# Patient Record
Sex: Female | Born: 1975 | Race: White | Hispanic: No | Smoking: Former smoker
Health system: Southern US, Community
[De-identification: ages and names within clinical notes are randomized; demographics above are authoritative.]

## PROBLEM LIST (undated history)

## (undated) DIAGNOSIS — M5136 Other intervertebral disc degeneration, lumbar region: Secondary | ICD-10-CM

## (undated) DIAGNOSIS — N200 Calculus of kidney: Secondary | ICD-10-CM

## (undated) DIAGNOSIS — M5030 Other cervical disc degeneration, unspecified cervical region: Secondary | ICD-10-CM

## (undated) DIAGNOSIS — K219 Gastro-esophageal reflux disease without esophagitis: Secondary | ICD-10-CM

## (undated) HISTORY — DX: Other intervertebral disc degeneration, lumbar region: M51.36

## (undated) HISTORY — DX: Gastro-esophageal reflux disease without esophagitis: K21.9

## (undated) HISTORY — DX: Calculus of kidney: N20.0

## (undated) HISTORY — DX: Other cervical disc degeneration, unspecified cervical region: M50.30

---

## 2001-10-02 DIAGNOSIS — M5136 Other intervertebral disc degeneration, lumbar region: Secondary | ICD-10-CM

## 2001-10-02 DIAGNOSIS — M51369 Other intervertebral disc degeneration, lumbar region without mention of lumbar back pain or lower extremity pain: Secondary | ICD-10-CM

## 2001-10-02 HISTORY — DX: Other intervertebral disc degeneration, lumbar region without mention of lumbar back pain or lower extremity pain: M51.369

## 2001-10-02 HISTORY — DX: Other intervertebral disc degeneration, lumbar region: M51.36

## 2002-01-29 ENCOUNTER — Encounter: Admission: RE | Admit: 2002-01-29 | Discharge: 2002-01-29 | Payer: Self-pay | Admitting: Orthopedic Surgery

## 2002-01-29 ENCOUNTER — Encounter: Payer: Self-pay | Admitting: Orthopedic Surgery

## 2002-02-12 ENCOUNTER — Encounter: Payer: Self-pay | Admitting: Orthopedic Surgery

## 2002-02-12 ENCOUNTER — Encounter: Admission: RE | Admit: 2002-02-12 | Discharge: 2002-02-12 | Payer: Self-pay | Admitting: Orthopedic Surgery

## 2010-07-02 DIAGNOSIS — N200 Calculus of kidney: Secondary | ICD-10-CM

## 2010-07-02 HISTORY — DX: Calculus of kidney: N20.0

## 2010-12-01 DIAGNOSIS — M5030 Other cervical disc degeneration, unspecified cervical region: Secondary | ICD-10-CM

## 2010-12-01 HISTORY — DX: Other cervical disc degeneration, unspecified cervical region: M50.30

## 2011-03-17 ENCOUNTER — Encounter: Payer: Self-pay | Admitting: Family Medicine

## 2011-03-17 ENCOUNTER — Ambulatory Visit (INDEPENDENT_AMBULATORY_CARE_PROVIDER_SITE_OTHER): Payer: 59 | Admitting: Family Medicine

## 2011-03-17 ENCOUNTER — Telehealth: Payer: Self-pay | Admitting: *Deleted

## 2011-03-17 VITALS — BP 144/91 | HR 99 | Ht 65.0 in | Wt 221.0 lb

## 2011-03-17 DIAGNOSIS — E669 Obesity, unspecified: Secondary | ICD-10-CM

## 2011-03-17 DIAGNOSIS — M5412 Radiculopathy, cervical region: Secondary | ICD-10-CM

## 2011-03-17 DIAGNOSIS — M5090 Cervical disc disorder, unspecified, unspecified cervical region: Secondary | ICD-10-CM

## 2011-03-17 DIAGNOSIS — M5010 Cervical disc disorder with radiculopathy, unspecified cervical region: Secondary | ICD-10-CM

## 2011-03-17 MED ORDER — OXYCODONE-ACETAMINOPHEN 5-500 MG PO TABS: ORAL_TABLET | ORAL | Status: DC

## 2011-03-17 MED ORDER — AMITRIPTYLINE HCL 25 MG PO TABS: 25.0000 mg | ORAL_TABLET | Freq: Every day | ORAL | Status: AC

## 2011-03-17 NOTE — Telephone Encounter (Signed)
error 

## 2011-03-17 NOTE — Patient Instructions (Signed)
Neurosurg referral put in for Dr Lovell Sheehan.  Start Amitriptyline at night for nerve root pain.  Cut in half if they make you too sleepy in the morning.  Use Percocet 1/2 to 1 tab up to 3 x  A day for severe pain.  Can alternate with Aleve 2 tabs in the AM and 2 tabs at night with food.  Use Colace + Miralax if needed for constipation.  Return for f/u in 4 wks.
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
## 2011-03-19 ENCOUNTER — Encounter: Payer: Self-pay | Admitting: Family Medicine

## 2011-03-19 DIAGNOSIS — E669 Obesity, unspecified: Secondary | ICD-10-CM | POA: Insufficient documentation

## 2011-03-19 DIAGNOSIS — M5412 Radiculopathy, cervical region: Secondary | ICD-10-CM | POA: Insufficient documentation

## 2011-03-19 NOTE — Assessment & Plan Note (Signed)
I reviewed her MRI done in the past wk, + for disc dz with extrusion.  Because she has weakness into both arms, affecting function, I agree with having her see neurosurgery.  Referral made to [facility] to review her case with her MRI.  She has already been on oral steroids, pain meds, NSAIDs, muscle relaxers w/o much benefit.    Will change her Tylenol #3 to Percocet, using 1/2 to 1 tab up to 3 x a day.  Cautioned about sedation and constipation.  Will keep her out of work until she sees the Careers adviser.  Will add Amitriptyline at night for nerve root pain/ sleep.  Will need to re-eval her anxiety and sleep issues to see if this is fully related to her daily pain or not.

## 2011-04-24 ENCOUNTER — Ambulatory Visit (INDEPENDENT_AMBULATORY_CARE_PROVIDER_SITE_OTHER): Payer: Self-pay | Admitting: Family Medicine

## 2011-04-24 ENCOUNTER — Encounter: Payer: Self-pay | Admitting: Family Medicine

## 2011-04-24 VITALS — BP 121/76 | HR 84 | Ht 65.0 in | Wt 221.0 lb

## 2011-04-24 DIAGNOSIS — M542 Cervicalgia: Secondary | ICD-10-CM

## 2011-04-24 MED ORDER — OXYCODONE-ACETAMINOPHEN 5-500 MG PO TABS: ORAL_TABLET | ORAL | Status: DC
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
## 2011-04-24 NOTE — Patient Instructions (Signed)
Go up on Amitriptyline to a full tab every night. Stay on Aleve with breakfast and dinner, using Percocet for severe pain breakthrough.  Will fax MRI results to Dr Lovell Sheehan.  If you don't hear back about an appointment by tomorrow, call their office to schedule.  I will mail your forms in for you.  Return for f/u in 2 mos.

## 2011-05-01 ENCOUNTER — Telehealth: Payer: Self-pay | Admitting: Family Medicine

## 2011-05-01 NOTE — Telephone Encounter (Signed)
Pt aware. Letter is at front for Pt to pickup

## 2011-05-01 NOTE — Telephone Encounter (Signed)
lettter printed.  Let me know where she wants it to go.

## 2011-05-01 NOTE — Telephone Encounter (Signed)
Pt called and said she was seen in the office recently and wanted to discuss with Dr. Cathey Endow about getting massage/body work therapy, but said she got into conversation about other things and forgot to mention.  Wants to know if Dr. Cathey Endow can write a note for medical clearance for her to get massage/body work therapy.  Has a friend willing to do for free, but said the pt has to have a note for medical clearance. Please advise. Jarvis Newcomer, LPN [facility]

## 2011-05-18 ENCOUNTER — Telehealth: Payer: Self-pay | Admitting: Family Medicine

## 2011-05-18 NOTE — Telephone Encounter (Signed)
Patient called request a call back would like to speak with you about her disability Dr. Cathey Endow wrote her out of work until Sept 18th but she needs some documentation sent to the disability company and would like for you to call her to discuss.

## 2011-05-19 NOTE — Telephone Encounter (Signed)
Pt to call and get fax # so I can fax work note

## 2011-06-07 ENCOUNTER — Ambulatory Visit (INDEPENDENT_AMBULATORY_CARE_PROVIDER_SITE_OTHER): Payer: 59 | Admitting: Family Medicine

## 2011-06-07 ENCOUNTER — Encounter: Payer: Self-pay | Admitting: Family Medicine

## 2011-06-07 VITALS — BP 105/65 | HR 76 | Ht 65.0 in | Wt 221.0 lb

## 2011-06-07 DIAGNOSIS — M5090 Cervical disc disorder, unspecified, unspecified cervical region: Secondary | ICD-10-CM

## 2011-06-07 MED ORDER — GABAPENTIN 300 MG PO CAPS: 300.0000 mg | ORAL_CAPSULE | Freq: Three times a day (TID) | ORAL | Status: DC

## 2011-06-19 ENCOUNTER — Telehealth: Payer: Self-pay | Admitting: Family Medicine

## 2011-06-19 NOTE — Telephone Encounter (Signed)
Pt called and was seen on 06-07-11 by Dr. Thurmond Butts.  She left forms for long term disability with him to complete, and pt has called several times and no one knows about the forms.  Pt information for short term was handles by a phone call, but pt says the forms she left with Dr. Thurmond Butts is for long term and she needs to get them back completed.  Told pt Dr. Thurmond Butts will be in on Tuesday and will ask him aobut the forms then. Plan:  Routed encounter to Dr. Thurmond Butts for review. Jarvis Newcomer, LPN [facility]

## 2011-06-20 ENCOUNTER — Ambulatory Visit: Payer: Self-pay | Admitting: Family Medicine

## 2011-06-20 ENCOUNTER — Telehealth: Payer: Self-pay | Admitting: Family Medicine
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
## 2011-06-21 ENCOUNTER — Telehealth: Payer: Self-pay | Admitting: Family Medicine

## 2011-06-21 NOTE — Telephone Encounter (Signed)
Message was given to Dr. Thurmond Butts yesterday. Jarvis Newcomer, LPN [facility]
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
## 2011-06-21 NOTE — Telephone Encounter (Signed)
Advised pt that her long term disability papers have been completed and she can pickup.  Told pt there will be a $20.00 charge. Jarvis Newcomer, LPN [facility]

## 2011-06-22 ENCOUNTER — Encounter (HOSPITAL_COMMUNITY)
Admission: RE | Admit: 2011-06-22 | Discharge: 2011-06-22 | Disposition: A | Discharge status: Home / Self Care | Payer: 59 | Source: Ambulatory Visit | Attending: Neurosurgery | Admitting: Neurosurgery

## 2011-06-22 LAB — CBC
HCT: 39.9 % (ref 36.0–46.0)
Hemoglobin: 14.1 g/dL (ref 12.0–15.0)
MCH: 30.9 pg (ref 26.0–34.0)
MCHC: 35.3 g/dL (ref 30.0–36.0)
MCV: 87.3 fL (ref 78.0–100.0)
Platelets: 220 10*3/uL (ref 150–400)
RBC: 4.57 MIL/uL (ref 3.87–5.11)
RDW: 12.8 % (ref 11.5–15.5)
WBC: 11 10*3/uL — ABNORMAL HIGH (ref 4.0–10.5)

## 2011-06-22 LAB — HCG, SERUM, QUALITATIVE: Preg, Serum: NEGATIVE

## 2011-06-22 LAB — SURGICAL PCR SCREEN: MRSA, PCR: NEGATIVE

## 2011-07-03 ENCOUNTER — Ambulatory Visit (HOSPITAL_COMMUNITY): Payer: 59

## 2011-07-03 ENCOUNTER — Ambulatory Visit (HOSPITAL_COMMUNITY)
Admission: RE | Admit: 2011-07-03 | Discharge: 2011-07-04 | Disposition: A | Discharge status: Home / Self Care | Payer: 59 | Source: Ambulatory Visit | Attending: Neurosurgery | Admitting: Neurosurgery

## 2011-07-03 DIAGNOSIS — M47812 Spondylosis without myelopathy or radiculopathy, cervical region: Secondary | ICD-10-CM | POA: Insufficient documentation

## 2011-07-03 DIAGNOSIS — E669 Obesity, unspecified: Secondary | ICD-10-CM | POA: Insufficient documentation

## 2011-07-03 DIAGNOSIS — M5020 Other cervical disc displacement, unspecified cervical region: Secondary | ICD-10-CM | POA: Insufficient documentation

## 2011-07-03 DIAGNOSIS — Z01812 Encounter for preprocedural laboratory examination: Secondary | ICD-10-CM | POA: Insufficient documentation

## 2011-07-03 HISTORY — PX: OTHER SURGICAL HISTORY: SHX169
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
## 2011-07-11 ENCOUNTER — Ambulatory Visit: Payer: Self-pay | Admitting: Family Medicine

## 2011-08-03 ENCOUNTER — Encounter: Payer: Self-pay | Admitting: Family Medicine

## 2012-05-21 IMAGING — CR DG CERVICAL SPINE 2 OR 3 VIEWS
1 series · 1 of 1 positions shown · non-contrast
Comparison: None.

CLINICAL DATA: Neck pain

CERVICAL SPINE - 2-3 VIEW

[view not recorded]
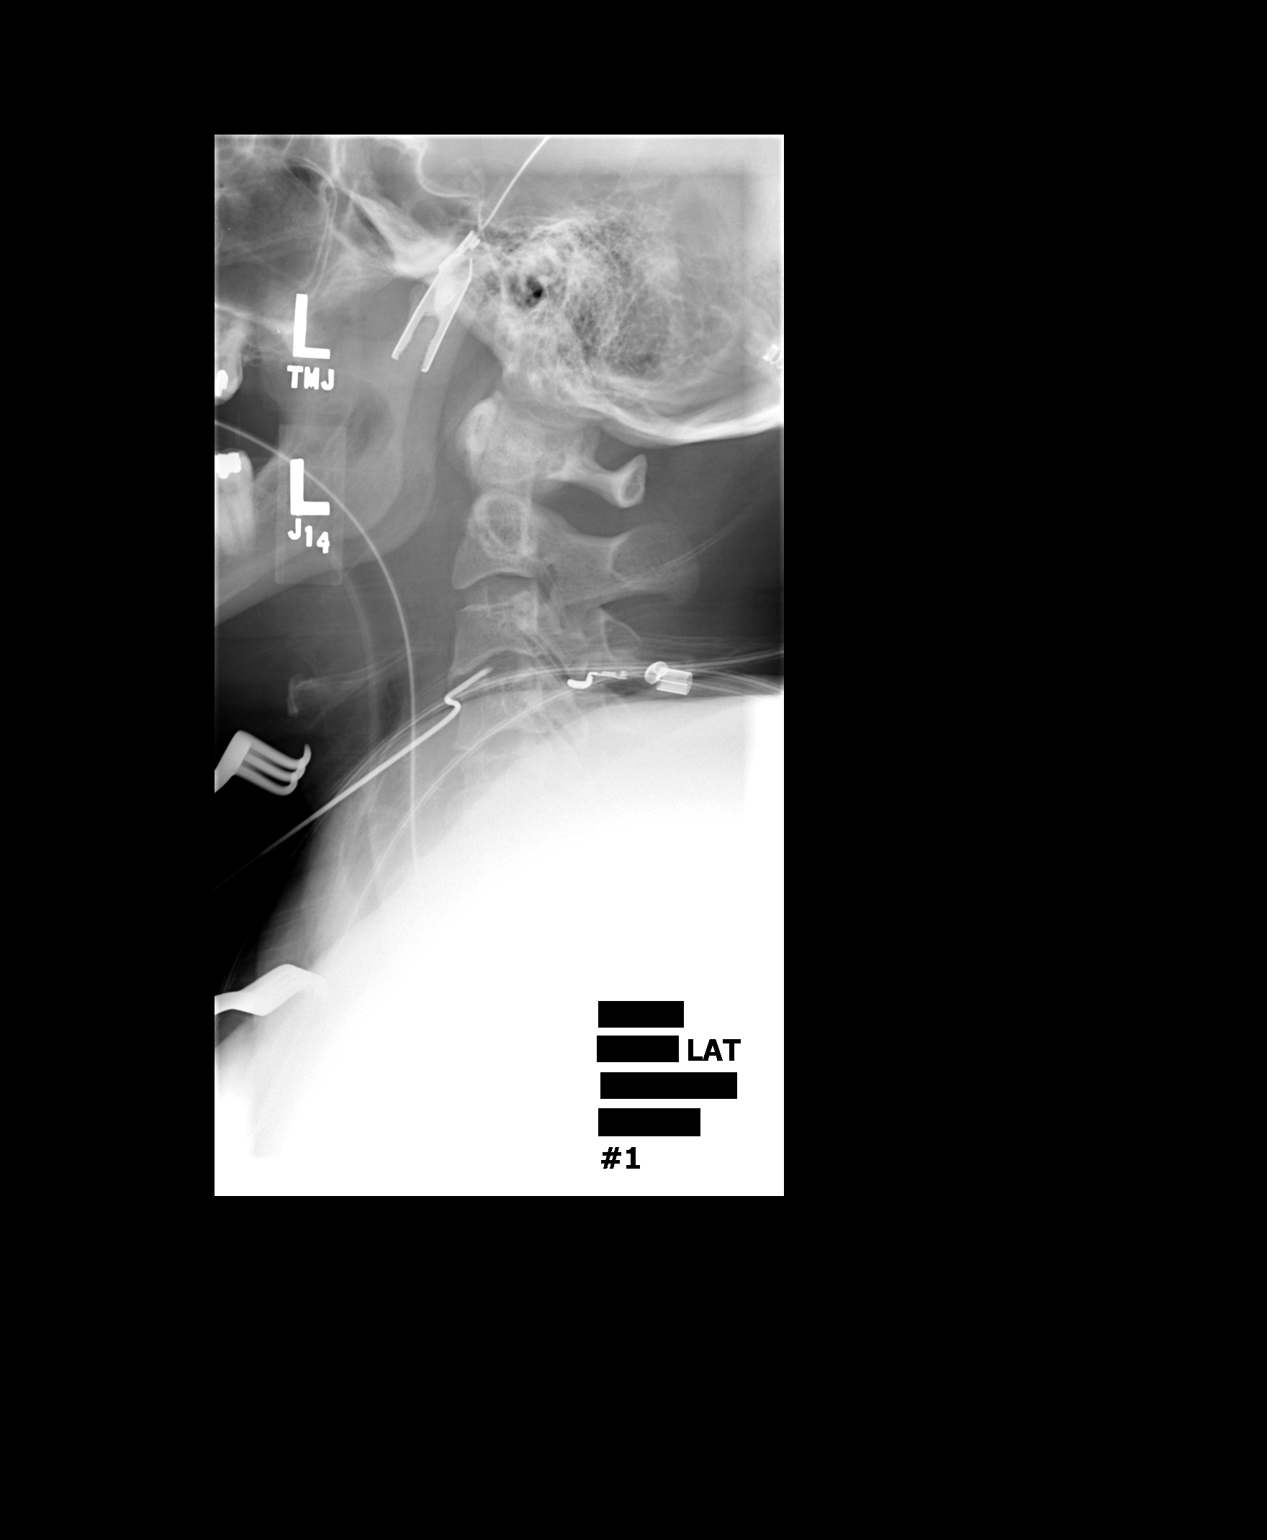

[1 of 1 positions shown; findings below may reference images not displayed]

FINDINGS: Film #1 demonstrates a needle at C3-C4.  Film #2
demonstrates a C4-5 and C6-7 ACDF with plating.  The C6-7 fusion is
incompletely evaluated due to shoulder artifact.  A sponge is seen
in the soft tissues of the neck.
IMPRESSION: C4-5 and C6-7 fusion as described.

## 2012-05-21 IMAGING — CR DG CERVICAL SPINE 2 OR 3 VIEWS
1 series · 1 of 1 positions shown · non-contrast
Comparison: None.

CLINICAL DATA: Neck pain

CERVICAL SPINE - 2-3 VIEW

[view not recorded]
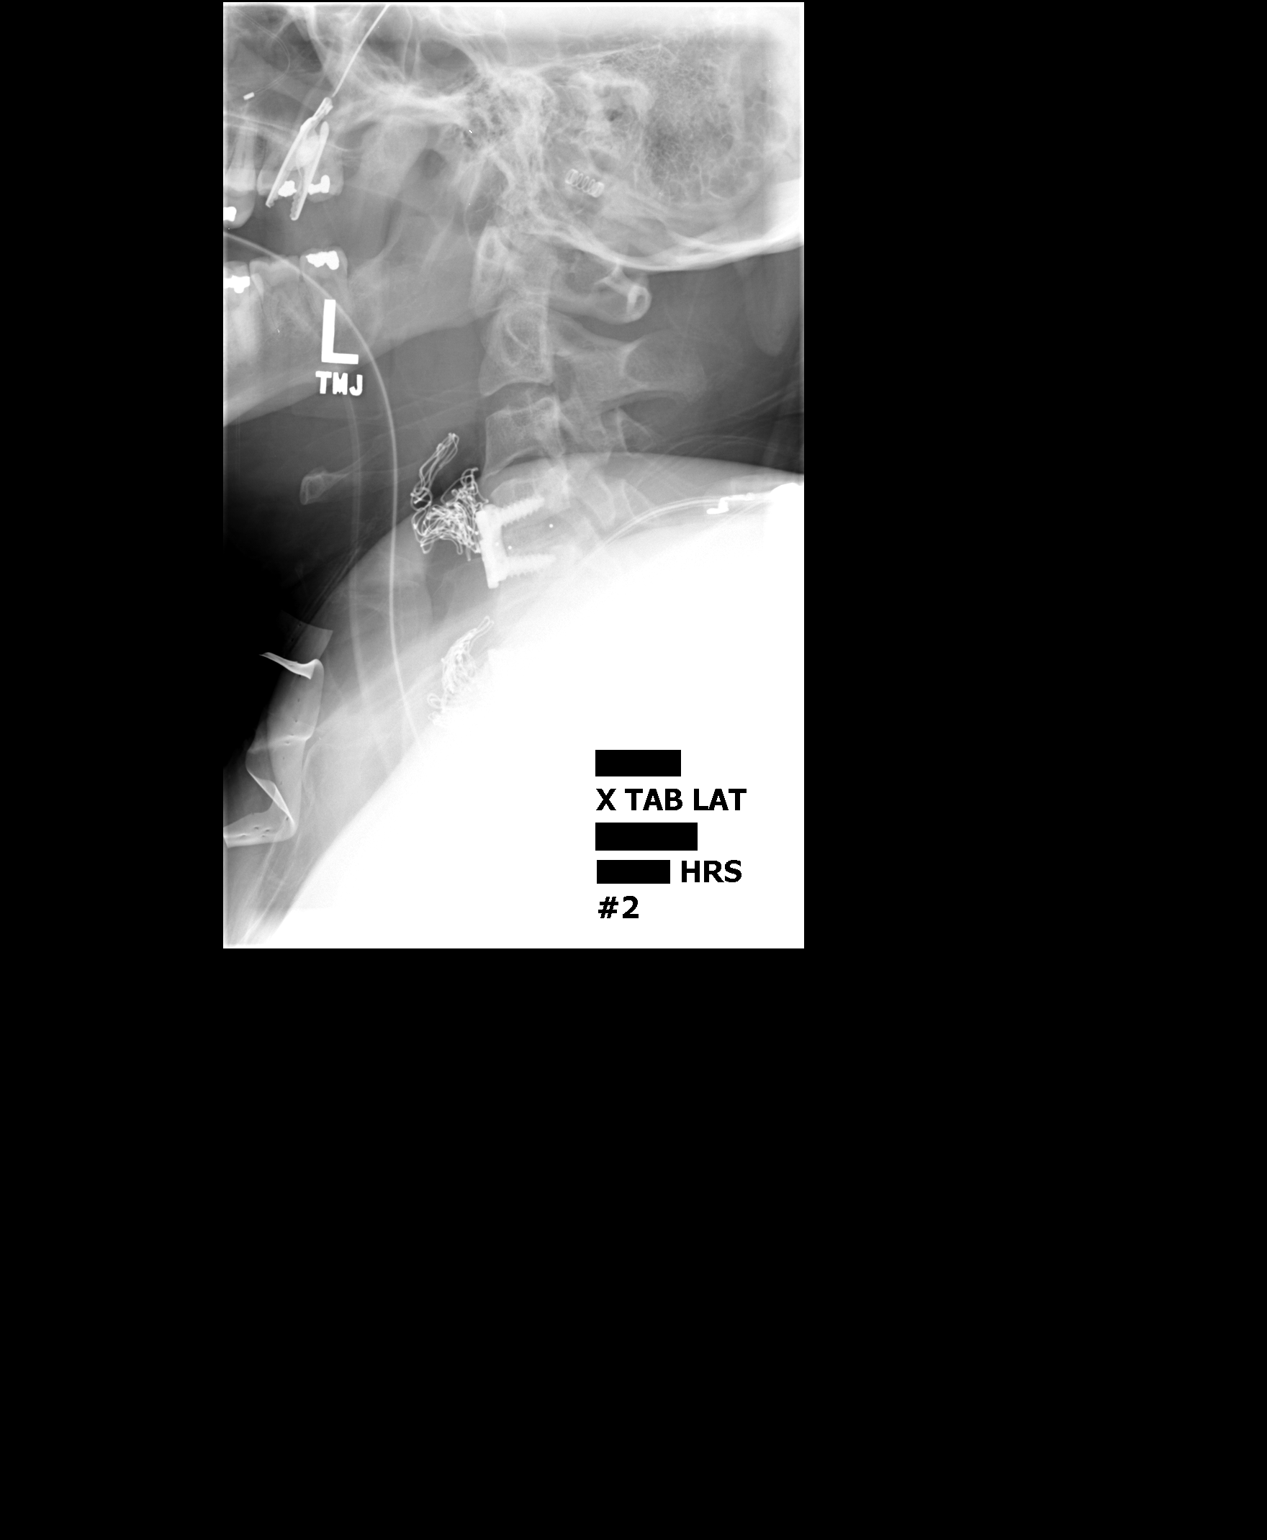

[1 of 1 positions shown; findings below may reference images not displayed]

FINDINGS: Film #1 demonstrates a needle at C3-C4.  Film #2
demonstrates a C4-5 and C6-7 ACDF with plating.  The C6-7 fusion is
incompletely evaluated due to shoulder artifact.  A sponge is seen
in the soft tissues of the neck.
IMPRESSION: C4-5 and C6-7 fusion as described.

## 2012-06-06 ENCOUNTER — Encounter: Payer: Self-pay | Admitting: Obstetrics & Gynecology

## 2012-06-06 ENCOUNTER — Other Ambulatory Visit (HOSPITAL_COMMUNITY)
Admission: RE | Admit: 2012-06-06 | Discharge: 2012-06-06 | Disposition: A | Discharge status: Home / Self Care | Payer: Medicaid Other | Source: Ambulatory Visit | Attending: Obstetrics & Gynecology | Admitting: Obstetrics & Gynecology

## 2012-06-06 ENCOUNTER — Ambulatory Visit (INDEPENDENT_AMBULATORY_CARE_PROVIDER_SITE_OTHER): Payer: Medicaid Other | Admitting: Obstetrics & Gynecology

## 2012-06-06 VITALS — BP 111/72 | HR 74 | Temp 98.5°F | Resp 16 | Ht 65.0 in | Wt 186.0 lb

## 2012-06-06 DIAGNOSIS — Z01419 Encounter for gynecological examination (general) (routine) without abnormal findings: Secondary | ICD-10-CM

## 2012-06-06 DIAGNOSIS — F419 Anxiety disorder, unspecified: Secondary | ICD-10-CM

## 2012-06-06 DIAGNOSIS — Z1151 Encounter for screening for human papillomavirus (HPV): Secondary | ICD-10-CM | POA: Insufficient documentation

## 2012-06-06 DIAGNOSIS — Z113 Encounter for screening for infections with a predominantly sexual mode of transmission: Secondary | ICD-10-CM | POA: Insufficient documentation

## 2012-06-06 DIAGNOSIS — Z308 Encounter for other contraceptive management: Secondary | ICD-10-CM

## 2012-06-06 NOTE — Patient Instructions (Signed)

## 2012-06-06 NOTE — Progress Notes (Signed)
  Subjective:     Erin Howell is a 36 y.o. female here for a routine exam.  Current complaints: wants birth control.  Personal health questionnaire reviewed: yes.   Gynecologic History Patient's last menstrual period was 05/21/2012. Contraception: none Last Pap: 5 yrs ago. Results were: normal Last mammogram: n/a. Results were: n/a  Obstetric History OB History    Grav Para Term Preterm Abortions TAB SAB Ect Mult Living   0 0 0 0 0 0 0 0 0 0        The following portions of the patient's history were reviewed and updated as appropriate: allergies, current medications, past family history, past medical history, past social history, past surgical history and problem list.  Review of Systems A comprehensive review of systems was negative except for: Behavioral/Psych: positive for anxiety    Objective:    Vitals:  WNL General appearance: alert, cooperative and no distress Head: Normocephalic, without obvious abnormality, atraumatic Eyes: negative Throat: lips, mucosa, and tongue normal; teeth and gums normal Lungs: clear to auscultation bilaterally Breasts: normal appearance, no masses or tenderness, No nipple retraction or dimpling, No nipple discharge or bleeding Heart: regular rate and rhythm Abdomen: soft, non-tender; bowel sounds normal; no masses,  no organomegaly Pelvic: cervix normal in appearance, external genitalia normal, no adnexal masses or tenderness, no bladder tenderness, no cervical motion tenderness, perianal skin: no external genital warts noted, rectovaginal septum normal, urethra without abnormality or discharge, uterus normal size, shape, and consistency and vagina normal without discharge Extremities: no edema, redness or tenderness in the calves or thighs Skin: no lesions or rash Lymph nodes: Axillary adenopathy: none       Assessment:    Healthy female exam.    Plan:    Education reviewed: safe sex/STD prevention, self breast exams, skin cancer  screening and weight bearing exercise. Contraception: IUD. Follow up in: 2 weeks. GC/Chlam doneWill insert IUD in 2 weeks.

## 2012-06-13 ENCOUNTER — Encounter: Payer: Self-pay | Admitting: Obstetrics & Gynecology

## 2012-06-13 ENCOUNTER — Ambulatory Visit (INDEPENDENT_AMBULATORY_CARE_PROVIDER_SITE_OTHER): Payer: Medicaid Other | Admitting: Obstetrics & Gynecology

## 2012-06-13 VITALS — BP 112/67 | HR 82 | Temp 98.6°F | Resp 16 | Wt 182.0 lb

## 2012-06-13 DIAGNOSIS — Z30430 Encounter for insertion of intrauterine contraceptive device: Secondary | ICD-10-CM

## 2012-06-13 DIAGNOSIS — IMO0001 Reserved for inherently not codable concepts without codable children: Secondary | ICD-10-CM

## 2012-06-13 DIAGNOSIS — Z01812 Encounter for preprocedural laboratory examination: Secondary | ICD-10-CM

## 2012-06-13 MED ORDER — LEVONORGESTREL 20 MCG/24HR IU IUD
INTRAUTERINE_SYSTEM | Freq: Once | INTRAUTERINE | Status: AC
  Administered 2012-06-13: 16:00:00 via INTRAUTERINE

## 2012-06-13 NOTE — Progress Notes (Signed)
  Subjective:    Patient ID: Erin Howell, female    DOB: 1976-03-05, 36 y.o.   MRN: [id number]  HPI  36 yo G0 who does not want any pregnancies for at least 4-5 years is here because she would like to have Mirena for her birth control method. All questions answered. We discussed the risk of DUB, amenorrhea as well as the increased incidence of ovarian cysts. She wishes to proceed.  Review of Systems     Objective:   Physical Exam UPT negative, time out done, consent signed. Bimanual reveals NSSA, NT, mobile uterus and non enlarged, non-tender adnexa Her cervix was prepped with betadine and grasped with a single tooth tenaculum. The Mirena was easily placed and the strings were cut to about 4 cm. She tolerated the procedure well.       Assessment & Plan:  Contraception- Mirena RTC 1 month for a string check.

## 2012-06-17 ENCOUNTER — Encounter: Payer: Self-pay | Admitting: Obstetrics & Gynecology

## 2012-06-17 DIAGNOSIS — R896 Abnormal cytological findings in specimens from other organs, systems and tissues: Secondary | ICD-10-CM

## 2012-06-18 ENCOUNTER — Encounter: Payer: Self-pay | Admitting: *Deleted

## 2012-06-18 ENCOUNTER — Telehealth: Payer: Self-pay | Admitting: *Deleted

## 2012-06-18 NOTE — Telephone Encounter (Signed)
Copy of pap smear results mailed to pt's home address along with information concerning ASCUS pap.  She is to have repeat pap and HPV in one year.

## 2012-06-20 ENCOUNTER — Ambulatory Visit: Payer: Medicaid Other | Admitting: Obstetrics & Gynecology

## 2018-06-27 ENCOUNTER — Emergency Department
Admission: EM | Admit: 2018-06-27 | Discharge: 2018-06-27 | Disposition: A | Discharge status: Home / Self Care | Payer: Self-pay | Source: Home / Self Care | Attending: Family Medicine | Admitting: Family Medicine

## 2018-06-27 ENCOUNTER — Emergency Department (INDEPENDENT_AMBULATORY_CARE_PROVIDER_SITE_OTHER): Payer: Self-pay

## 2018-06-27 ENCOUNTER — Other Ambulatory Visit: Payer: Self-pay

## 2018-06-27 DIAGNOSIS — S93402A Sprain of unspecified ligament of left ankle, initial encounter: Secondary | ICD-10-CM

## 2018-06-27 DIAGNOSIS — M722 Plantar fascial fibromatosis: Secondary | ICD-10-CM

## 2018-06-27 DIAGNOSIS — M25572 Pain in left ankle and joints of left foot: Secondary | ICD-10-CM

## 2018-06-27 DIAGNOSIS — M79672 Pain in left foot: Secondary | ICD-10-CM

## 2018-06-27 DIAGNOSIS — S93602A Unspecified sprain of left foot, initial encounter: Secondary | ICD-10-CM
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
## 2018-06-27 NOTE — Discharge Instructions (Signed)
°  You may take 500mg  acetaminophen every 4-6 hours or in combination with ibuprofen 400-600mg  every 6-8 hours as needed for pain and inflammation.  Please follow up with family medicine or sports medicine in 1 week if not improving.
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
## 2018-06-27 NOTE — ED Notes (Signed)
DJ Aircast fitted per Leeroy Cha, PAC

## 2019-06-27 ENCOUNTER — Other Ambulatory Visit: Payer: Self-pay

## 2019-06-27 DIAGNOSIS — Z20822 Contact with and (suspected) exposure to covid-19: Secondary | ICD-10-CM

## 2019-06-28 LAB — NOVEL CORONAVIRUS, NAA: SARS-CoV-2, NAA: NOT DETECTED

## 2021-05-17 ENCOUNTER — Other Ambulatory Visit: Payer: Self-pay | Admitting: Obstetrics & Gynecology

## 2021-05-17 DIAGNOSIS — E049 Nontoxic goiter, unspecified: Secondary | ICD-10-CM

## 2021-05-20 ENCOUNTER — Ambulatory Visit
Admission: RE | Admit: 2021-05-20 | Discharge: 2021-05-20 | Disposition: A | Discharge status: Home / Self Care | Payer: 59 | Source: Ambulatory Visit | Attending: Obstetrics & Gynecology | Admitting: Obstetrics & Gynecology

## 2021-05-20 DIAGNOSIS — E049 Nontoxic goiter, unspecified: Secondary | ICD-10-CM

## 2021-05-24 ENCOUNTER — Other Ambulatory Visit: Payer: Self-pay | Admitting: Obstetrics & Gynecology

## 2021-06-29 ENCOUNTER — Other Ambulatory Visit: Payer: Self-pay | Admitting: Obstetrics & Gynecology

## 2021-06-29 DIAGNOSIS — R928 Other abnormal and inconclusive findings on diagnostic imaging of breast: Secondary | ICD-10-CM

## 2021-07-07 ENCOUNTER — Ambulatory Visit
Admission: RE | Admit: 2021-07-07 | Discharge: 2021-07-07 | Disposition: A | Discharge status: Home / Self Care | Payer: 59 | Source: Ambulatory Visit | Attending: Obstetrics & Gynecology | Admitting: Obstetrics & Gynecology

## 2021-07-07 ENCOUNTER — Other Ambulatory Visit: Payer: Self-pay

## 2021-07-07 DIAGNOSIS — R928 Other abnormal and inconclusive findings on diagnostic imaging of breast: Secondary | ICD-10-CM

## 2021-07-12 ENCOUNTER — Other Ambulatory Visit: Payer: Self-pay | Admitting: Obstetrics & Gynecology

## 2021-07-12 DIAGNOSIS — E041 Nontoxic single thyroid nodule: Secondary | ICD-10-CM

## 2021-07-19 ENCOUNTER — Ambulatory Visit
Admission: RE | Admit: 2021-07-19 | Discharge: 2021-07-19 | Disposition: A | Discharge status: Home / Self Care | Payer: 59 | Source: Ambulatory Visit | Attending: Obstetrics & Gynecology | Admitting: Obstetrics & Gynecology

## 2021-07-19 ENCOUNTER — Other Ambulatory Visit (HOSPITAL_COMMUNITY)
Admission: RE | Admit: 2021-07-19 | Discharge: 2021-07-19 | Disposition: A | Discharge status: Home / Self Care | Payer: 59 | Source: Ambulatory Visit | Attending: Radiology | Admitting: Radiology

## 2021-07-19 DIAGNOSIS — E041 Nontoxic single thyroid nodule: Secondary | ICD-10-CM

## 2021-07-19 DIAGNOSIS — D34 Benign neoplasm of thyroid gland: Secondary | ICD-10-CM | POA: Diagnosis not present

## 2021-07-19 DIAGNOSIS — E042 Nontoxic multinodular goiter: Secondary | ICD-10-CM | POA: Diagnosis present

## 2021-07-20 LAB — CYTOLOGY - NON PAP

## 2022-06-07 IMAGING — US US FNA BIOPSY THYROID 1ST LESION
2 series · 13 of 25 positions shown · non-contrast
Comparison: Thyroid ultrasound performed 05/20/2021

MEDICATIONS:
1% plain lidocaine, 2 mL

COMPLICATIONS:
None immediate.

INDICATION: Indeterminate right and left thyroid nodules

EXAM:
ULTRASOUND GUIDED FINE NEEDLE ASPIRATION OF INDETERMINATE RIGHT AND
LEFT THYROID NODULES
TECHNIQUE: Informed written consent was obtained from the patient after a
discussion of the risks, benefits and alternatives to treatment.
Questions regarding the procedure were encouraged and answered. A
timeout was performed prior to the initiation of the procedure.

[Series 1: us fna biopsy thyroid 1st lesion · 0.06mm/px · 25 acquisitions, 7 frames shown (1 of 2)]
[im 1/25]
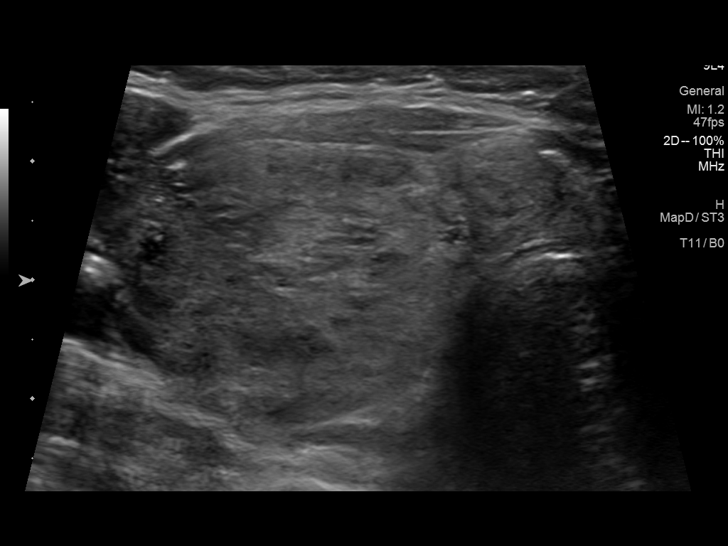
[im 5/25]
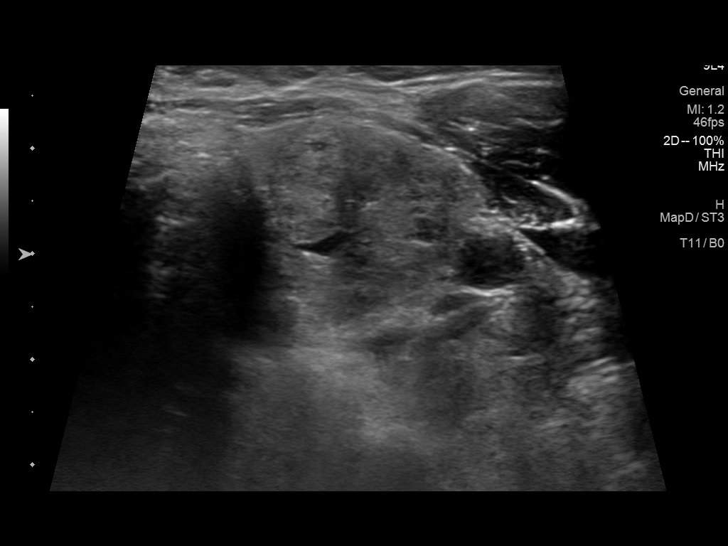
[im 9/25]
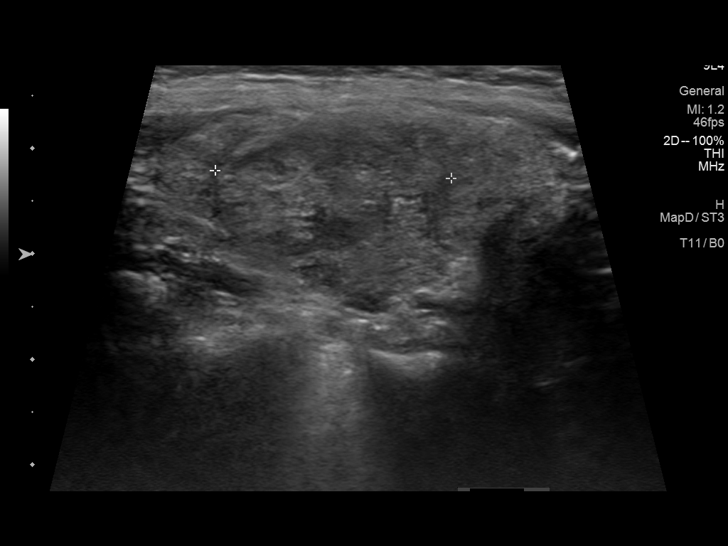
[im 13/25]
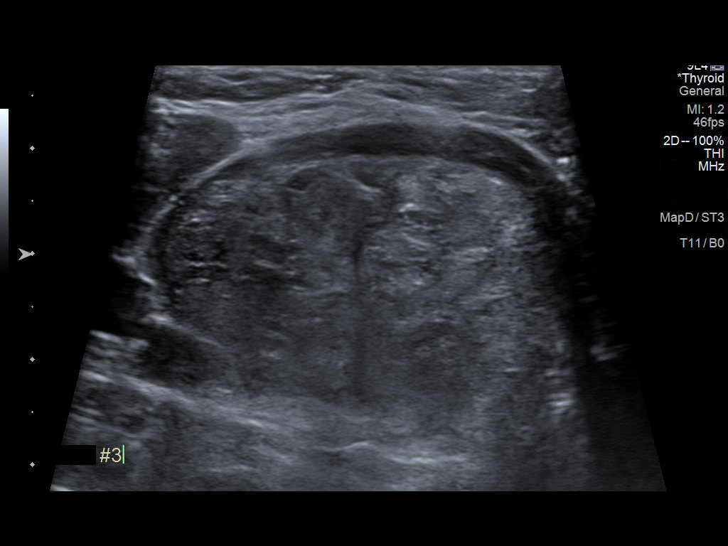
[im 17/25]
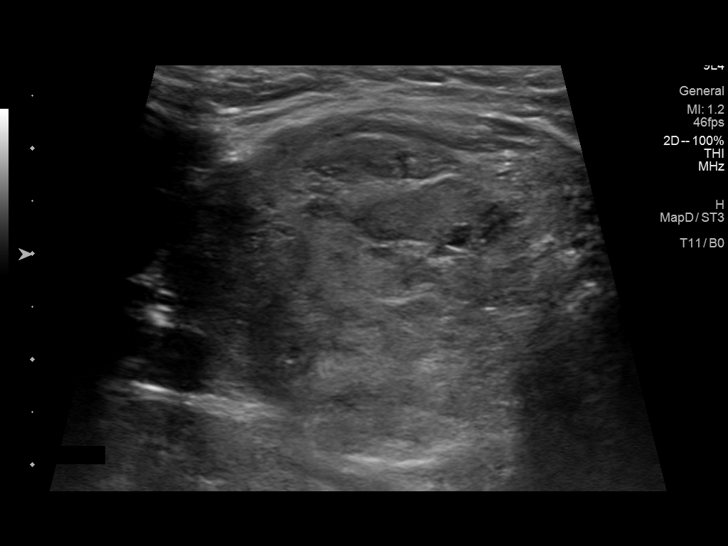
[im 21/25]
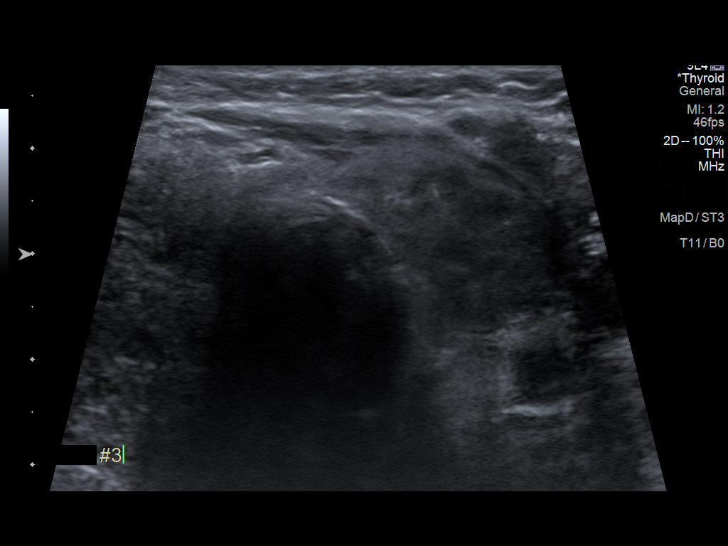
[im 25/25]
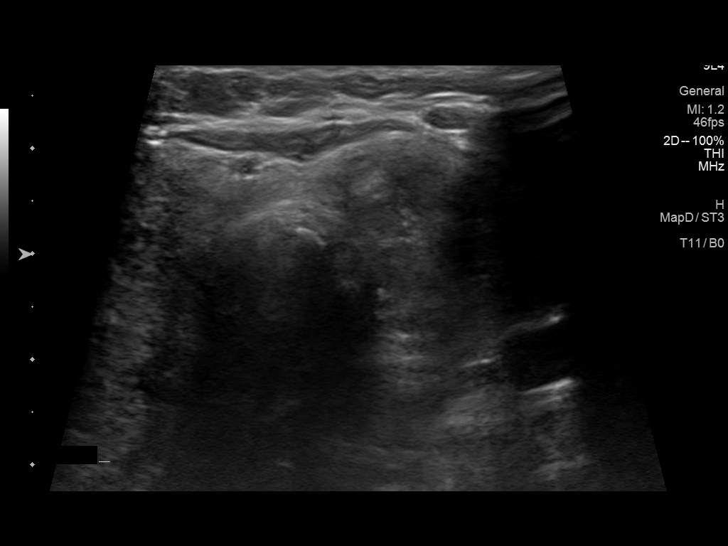

[Series 1: us fna biopsy thyroid 1st lesion · 0.07mm/px · 25 acquisitions, 6 frames shown (2 of 2)]
[im 3/25]
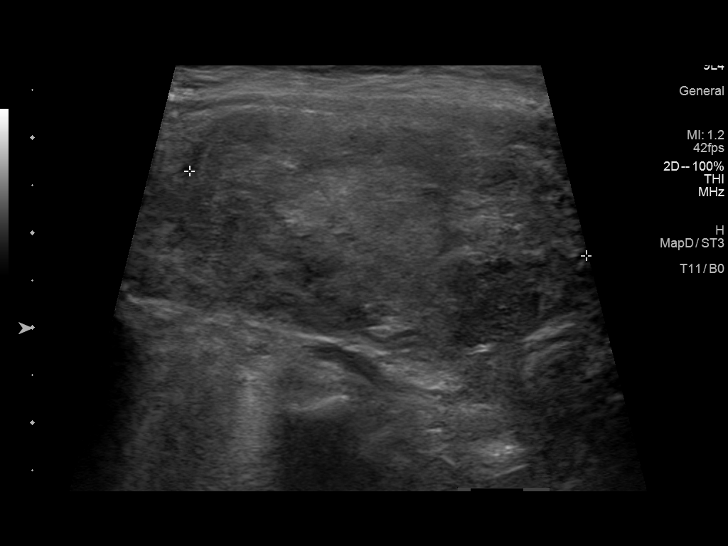
[im 7/25]
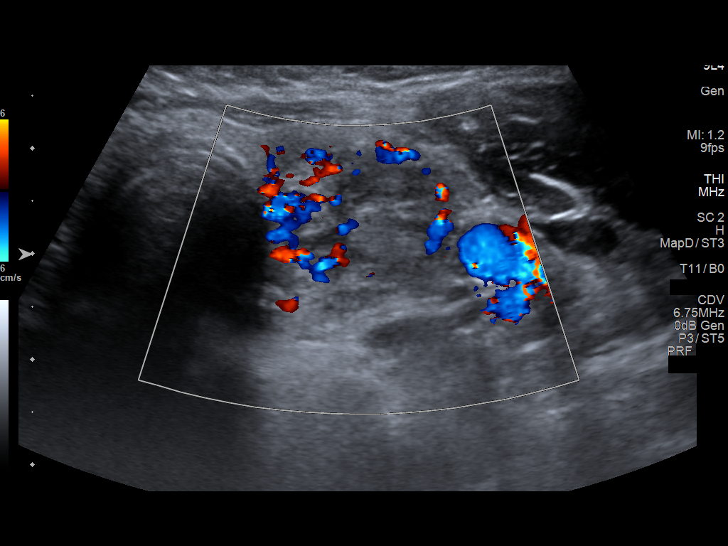
[im 11/25]
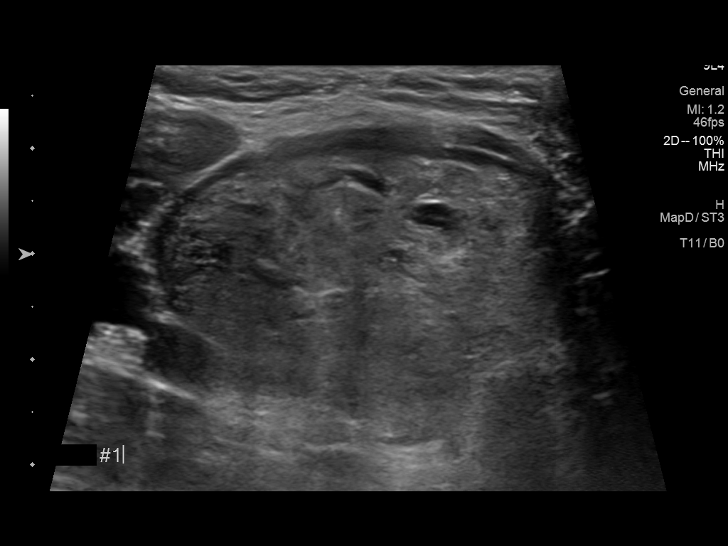
[im 16/25]
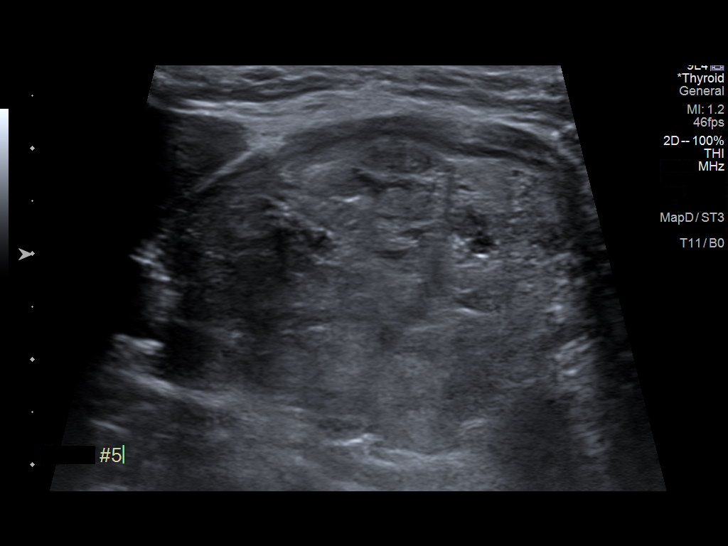
[im 20/25]
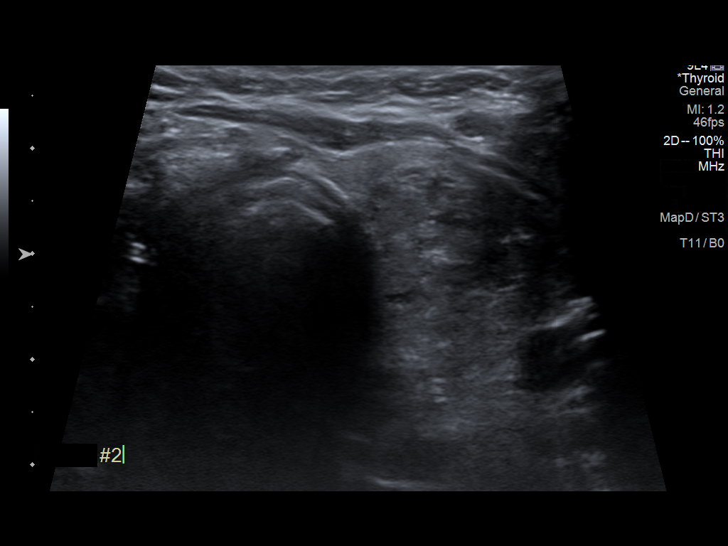
[im 25/25]
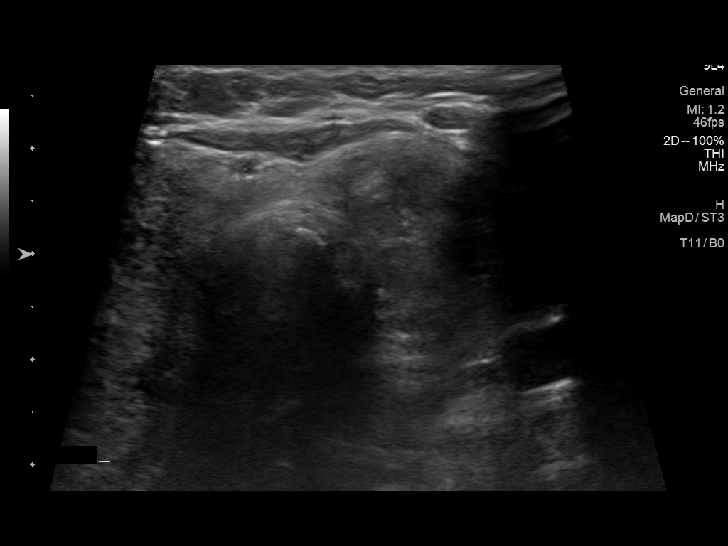

[13 of 25 positions shown; findings below may reference images not displayed]

Pre-procedural ultrasound scanning demonstrated unchanged size and
appearance of the indeterminate nodules within the right and left
thyroid lobes.

The procedure was planned. The neck was prepped in the usual sterile
fashion, and a sterile drape was applied covering the operative
field. A timeout was performed prior to the initiation of the
procedure. Local anesthesia was provided with 1% lidocaine.

Under direct ultrasound guidance, 5 FNA biopsies were performed of
the right inferior thyroid nodule with a 27 gauge needle. Multiple
ultrasound images were saved for procedural documentation purposes.
The samples were prepared and submitted to pathology.

Under direct ultrasound guidance, 5 FNA biopsies were performed of
the left mid thyroid nodule with a 27 gauge needle. Multiple
ultrasound images were saved for procedural documentation purposes.
The samples were prepared and submitted to pathology.

Limited post procedural scanning was negative for hematoma or
additional complication. Dressings were placed. The patient
tolerated the above procedures procedure well without immediate
postprocedural complication.
FINDINGS: FINDINGS
Nodule reference number based on prior diagnostic ultrasound: 1

Maximum size: 4.3 cm

Location: Right  ;  Inferior

ACR TI-RADS total points: 3

ACR TI-RADS risk category:   TR3 (3 points)

Prior biopsy:  No

Reason for biopsy: meets ACR TI-RADS criteria

_________________________________________________________

Nodule reference number based on prior diagnostic ultrasound: 2

Maximum size: 3.3 cm

Location: Left  ;  Mid

ACR TI-RADS total points: 3

ACR TI-RADS risk category:   TR3 (3 points)

Prior biopsy:  No

Reason for biopsy: meets ACR TI-RADS criteria

Ultrasound imaging confirms appropriate placement of the needles
within the thyroid nodule.
IMPRESSION: 1. Technically successful ultrasound guided fine needle aspiration
of right inferior thyroid nodule as described above.
2. Technically successful ultrasound guided fine needle aspiration
of left mid thyroid nodule as described above.

## 2023-07-02 ENCOUNTER — Other Ambulatory Visit: Payer: Self-pay | Admitting: Physician Assistant

## 2023-07-02 DIAGNOSIS — Z1231 Encounter for screening mammogram for malignant neoplasm of breast: Secondary | ICD-10-CM

## 2023-07-12 LAB — EXTERNAL GENERIC LAB PROCEDURE: COLOGUARD: NEGATIVE

## 2023-07-12 LAB — COLOGUARD: COLOGUARD: NEGATIVE

## 2023-07-23 ENCOUNTER — Ambulatory Visit
Admission: RE | Admit: 2023-07-23 | Discharge: 2023-07-23 | Disposition: A | Discharge status: Home / Self Care | Payer: Medicaid Other | Source: Ambulatory Visit | Attending: Physician Assistant | Admitting: Physician Assistant

## 2023-07-23 DIAGNOSIS — Z1231 Encounter for screening mammogram for malignant neoplasm of breast: Secondary | ICD-10-CM

## 2023-08-05 ENCOUNTER — Other Ambulatory Visit: Payer: Self-pay | Admitting: Medical Genetics

## 2023-08-05 DIAGNOSIS — Z006 Encounter for examination for normal comparison and control in clinical research program: Secondary | ICD-10-CM

## 2023-08-22 ENCOUNTER — Other Ambulatory Visit (HOSPITAL_COMMUNITY): Payer: Medicaid Other | Attending: Medical Genetics

## 2024-08-06 ENCOUNTER — Other Ambulatory Visit: Payer: Self-pay | Admitting: Medical Genetics

## 2024-08-06 DIAGNOSIS — Z006 Encounter for examination for normal comparison and control in clinical research program: Secondary | ICD-10-CM

## 2024-09-03 LAB — GENECONNECT MOLECULAR SCREEN: Genetic Analysis Overall Interpretation: NEGATIVE

## 2024-09-22 ENCOUNTER — Other Ambulatory Visit: Payer: Self-pay | Admitting: Family

## 2024-09-22 DIAGNOSIS — Z1231 Encounter for screening mammogram for malignant neoplasm of breast: Secondary | ICD-10-CM

## 2024-09-26 ENCOUNTER — Ambulatory Visit

## 2024-11-10 ENCOUNTER — Ambulatory Visit
